# Patient Record
Sex: Female | Born: 1995 | Race: White | Hispanic: No | Marital: Single | Smoking: Never smoker
Health system: Southern US, Community
[De-identification: ages and names within clinical notes are randomized; demographics above are authoritative.]

## PROBLEM LIST (undated history)

## (undated) DIAGNOSIS — Z9109 Other allergy status, other than to drugs and biological substances: Secondary | ICD-10-CM

## (undated) DIAGNOSIS — J45909 Unspecified asthma, uncomplicated: Secondary | ICD-10-CM

---
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
## 2016-09-13 ENCOUNTER — Emergency Department (HOSPITAL_COMMUNITY)

## 2016-09-13 ENCOUNTER — Encounter (HOSPITAL_COMMUNITY): Payer: Self-pay | Admitting: Nurse Practitioner

## 2016-09-13 ENCOUNTER — Emergency Department (HOSPITAL_COMMUNITY)
Admission: EM | Admit: 2016-09-13 | Discharge: 2016-09-13 | Disposition: A | Discharge status: Home / Self Care | Attending: Emergency Medicine | Admitting: Emergency Medicine

## 2016-09-13 DIAGNOSIS — J45909 Unspecified asthma, uncomplicated: Secondary | ICD-10-CM | POA: Insufficient documentation

## 2016-09-13 DIAGNOSIS — R0989 Other specified symptoms and signs involving the circulatory and respiratory systems: Secondary | ICD-10-CM | POA: Diagnosis not present

## 2016-09-13 DIAGNOSIS — T17308A Unspecified foreign body in larynx causing other injury, initial encounter: Secondary | ICD-10-CM

## 2016-09-13 DIAGNOSIS — R09A2 Foreign body sensation, throat: Secondary | ICD-10-CM

## 2016-09-13 HISTORY — DX: Unspecified asthma, uncomplicated: J45.909

## 2016-09-13 LAB — I-STAT CHEM 8, ED
BUN: 9 mg/dL (ref 6–20)
CALCIUM ION: 1.13 mmol/L — AB (ref 1.15–1.40)
CREATININE: 0.7 mg/dL (ref 0.44–1.00)
Chloride: 105 mmol/L (ref 101–111)
Glucose, Bld: 80 mg/dL (ref 65–99)
HCT: 42 % (ref 36.0–46.0)
Hemoglobin: 14.3 g/dL (ref 12.0–15.0)
Potassium: 3.8 mmol/L (ref 3.5–5.1)
Sodium: 140 mmol/L (ref 135–145)
TCO2: 23 mmol/L (ref 0–100)

## 2016-09-13 LAB — I-STAT BETA HCG BLOOD, ED (MC, WL, AP ONLY): I-stat hCG, quantitative: <5 m[IU]/mL (ref <5)

## 2016-09-13 MED ORDER — IOPAMIDOL (ISOVUE-300) INJECTION 61%
75.0000 mL | Freq: Once | INTRAVENOUS | Status: AC | PRN
  Administered 2016-09-13: 75 mL via INTRAVENOUS

## 2016-09-13 MED ORDER — SODIUM CHLORIDE 0.9 % IJ SOLN
INTRAMUSCULAR | Status: AC
  Administered 2016-09-13: 20:00:00
  Filled 2016-09-13: qty 50

## 2016-09-13 MED ORDER — MAGIC MOUTHWASH W/LIDOCAINE: 5.0000 mL | Freq: Four times a day (QID) | ORAL | 0 refills | Status: AC | PRN

## 2016-09-13 MED ORDER — IOPAMIDOL (ISOVUE-300) INJECTION 61%
INTRAVENOUS | Status: AC
  Administered 2016-09-13: 20:00:00
  Filled 2016-09-13: qty 75

## 2016-09-13 MED ORDER — RANITIDINE HCL 150 MG PO TABS: 150.0000 mg | ORAL_TABLET | Freq: Two times a day (BID) | ORAL | 0 refills | Status: DC

## 2016-09-13 MED ORDER — GI COCKTAIL ~~LOC~~
30.0000 mL | Freq: Once | ORAL | Status: AC
  Administered 2016-09-13: 30 mL via ORAL
  Filled 2016-09-13: qty 30

## 2016-09-13 MED ORDER — DEXAMETHASONE SODIUM PHOSPHATE 10 MG/ML IJ SOLN
10.0000 mg | Freq: Once | INTRAMUSCULAR | Status: AC
  Administered 2016-09-13: 10 mg via INTRAVENOUS
  Filled 2016-09-13: qty 1

## 2016-09-13 MED ORDER — RANITIDINE HCL 150 MG PO TABS: 150.0000 mg | ORAL_TABLET | Freq: Two times a day (BID) | ORAL | 0 refills | Status: AC

## 2016-09-13 MED ORDER — MAGIC MOUTHWASH W/LIDOCAINE: 5.0000 mL | Freq: Four times a day (QID) | ORAL | 0 refills | Status: DC | PRN

## 2016-09-13 MED ORDER — LIDOCAINE 5 % EX OINT
TOPICAL_OINTMENT | Freq: Once | CUTANEOUS | Status: AC
  Administered 2016-09-13: 16:00:00 via TOPICAL
  Filled 2016-09-13: qty 35.44

## 2016-09-13 NOTE — ED Triage Notes (Signed)
Pt states she may have swallow a plastic piece of the end of a [place] leg. Anxious about not being able to swallow, ABCs have been cleared by both triage RN and EDP.

## 2016-09-13 NOTE — ED Notes (Signed)
Patient transported to CT 

## 2016-09-13 NOTE — ED Notes (Signed)
ED Provider at bedside with 2 PAs, doing procedure.

## 2016-09-13 NOTE — ED Provider Notes (Signed)
[facility]-EMERGENCY DEPT Provider Note   CSN: [id number] Arrival date & time: 09/13/16  1528     History   Chief Complaint Chief Complaint  Patient presents with  . Choking    HPI Dominique Ortiz is a 20 y.o. female.  HPI 20 yo F with PMHx as below here with sensation of FB in throat. Pt was eating [place] skin earlier today when she accidentally swallowed a "long piece of plastic" that had been on the [place] prior to cooking. She feels like it is now stuck in her throat and she is having difficulty swallowing. She endorses severe, sharp, burning throat pain that is worse with swallowing and talking. Denies any coughing or SOB. She endorses extreme anxiety and feels like her throat is "closing up." Denies any prior injuries to the area. No h/o esophageal pathology.  Past Medical History:  Diagnosis Date  . Asthma   . Environmental allergies     There are no active problems to display for this patient.   History reviewed. No pertinent surgical history.  OB History    No data available       Home Medications    Prior to Admission medications   Medication Sig Start Date End Date Taking? Authorizing Provider  Doxycycline Hyclate 75 MG TABS Take 75 mg by mouth daily. 08/12/16  Yes Historical Provider, MD  NIKKI 3-0.02 MG tablet Take 1 tablet by mouth daily. 08/12/16  Yes Historical Provider, MD  magic mouthwash w/lidocaine SOLN Take 5 mLs by mouth 4 (four) times daily as needed for mouth pain. 09/13/16   Shaune Pollackameron Zay Yeargan, MD  oxymetazoline (AFRIN NASAL SPRAY) 0.05 % nasal spray Place 1 spray into both nostrils 2 (two) times daily. No more than three days Patient not taking: Reported on 09/13/2016 12/25/13   Marisa Severinlga Otter, MD  pseudoephedrine (SUDAFED 12 HOUR) 120 MG 12 hr tablet Take 1 tablet (120 mg total) by mouth 2 (two) times daily. Patient not taking: Reported on 09/13/2016 12/25/13   Marisa Severinlga Otter, MD  ranitidine (ZANTAC) 150 MG tablet Take 1 tablet (150 mg total) by mouth 2 (two)  times daily. 09/13/16 09/20/16  Shaune Pollackameron Lynsi Dooner, MD    Family History History reviewed. No pertinent family history.  Social History Social History  Substance Use Topics  . Smoking status: Never Smoker  . Smokeless tobacco: Never Used  . Alcohol use No     Allergies   Banana and Citrus   Review of Systems Review of Systems  Constitutional: Negative for chills and fever.  HENT: Positive for sore throat. Negative for congestion, rhinorrhea, trouble swallowing and voice change.   Eyes: Negative for visual disturbance.  Respiratory: Negative for cough, shortness of breath and wheezing.   Cardiovascular: Negative for chest pain and leg swelling.  Gastrointestinal: Negative for abdominal pain, diarrhea, nausea and vomiting.  Genitourinary: Negative for dysuria, flank pain, vaginal bleeding and vaginal discharge.  Musculoskeletal: Negative for neck pain.  Skin: Negative for rash.  Allergic/Immunologic: Negative for immunocompromised state.  Neurological: Negative for syncope and headaches.  Hematological: Does not bruise/bleed easily.  All other systems reviewed and are negative.    Physical Exam Updated Vital Signs BP 112/69 (BP Location: Right Arm)   Pulse 77   Temp 98 F (36.7 C) (Oral)   Resp 14   LMP 08/23/2016   SpO2 100%   Physical Exam  Constitutional: She is oriented to person, place, and time. She appears well-developed and well-nourished. She appears distressed.  HENT:  Head: Normocephalic  and atraumatic.  Mouth/Throat: No oropharyngeal exudate.  No oropharyngeal edema or trauma.  Eyes: Conjunctivae are normal.  Neck: Normal range of motion. Neck supple.  No crepitance. No bruits. Normal ROM. No stridor.  Cardiovascular: Normal rate, regular rhythm and normal heart sounds.  Exam reveals no friction rub.   No murmur heard. Pulmonary/Chest: Effort normal and breath sounds normal. No respiratory distress. She has no wheezes. She has no rales.  Abdominal: She  exhibits no distension.  Musculoskeletal: She exhibits no edema.  Neurological: She is alert and oriented to person, place, and time. She exhibits normal muscle tone.  Skin: Skin is warm. Capillary refill takes less than 2 seconds.  Psychiatric: She has a normal mood and affect.  Nursing note and vitals reviewed.    ED Treatments / Results  Labs (all labs ordered are listed, but only abnormal results are displayed) Labs Reviewed  I-STAT CHEM 8, ED - Abnormal; Notable for the following:       Result Value   Calcium, Ion 1.13 (*)    All other components within normal limits  I-STAT BETA HCG BLOOD, ED ([facility], [facility], [facility] ONLY)    EKG  EKG Interpretation None       Radiology Dg Neck Soft Tissue  Result Date: 09/13/2016 CLINICAL DATA:  20 year old female with possible foreign body ingestion. Foreign body sensation in neck. EXAM: NECK SOFT TISSUES - 1+ VIEW COMPARISON:  None. FINDINGS: There is no evidence of retropharyngeal soft tissue swelling or epiglottic enlargement. The cervical airway is unremarkable and no radio-opaque foreign body identified. IMPRESSION: Negative. Electronically Signed   By: Harmon PierJeffrey  Hu M.D.   On: 09/13/2016 15:58   Ct Soft Tissue Neck W Contrast  Result Date: 09/13/2016 CLINICAL DATA:  Sore throat. May have swallowed plastic piece at end of [place] leg. EXAM: CT NECK WITH CONTRAST TECHNIQUE: Multidetector CT imaging of the neck was performed using the standard protocol following the bolus administration of intravenous contrast. CONTRAST:  75mL ISOVUE-300 IOPAMIDOL (ISOVUE-300) INJECTION 61% COMPARISON:  None. FINDINGS: Pharynx and larynx: No foreign body, mass, or swelling. No central compartment edema or emphysema. Mild tonsillar hypertrophy. Salivary glands: No inflammation, mass, or stone. Thyroid: Single coarse calcification in the left lobe without evidence of associated noncalcified nodule. 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 No esophageal swelling or foreign body seen in this region. Limited intracranial: Negative Visualized orbits: Negative Mastoids and visualized paranasal sinuses: Patchy mild mucosal edema. Concha bullosa and rightward septal deviation. Skeleton: No acute or aggressive finding Upper chest: No visible foreign body or air leak. IMPRESSION: Negative.  No foreign body, swelling, or soft tissue gas. Electronically Signed   By: Marnee SpringJonathon  Watts M.D.   On: 09/13/2016 18:35    Procedures Procedures (including critical care time)  Medications Ordered in ED Medications  gi cocktail (Maalox,Lidocaine,Donnatal) (30 mLs Oral Given 09/13/16 1544)  lidocaine (XYLOCAINE) 5 % ointment ( Topical Given 09/13/16 1557)  iopamidol (ISOVUE-300) 61 % injection 75 mL (75 mLs Intravenous Contrast Given 09/13/16 1750)  sodium chloride 0.9 % injection (  Given by Other 09/13/16 1935)  iopamidol (ISOVUE-300) 61 % injection (  Contrast Given 09/13/16 1936)  dexamethasone (DECADRON) injection 10 mg (10 mg Intravenous Given 09/13/16 1926)    Transnasal Flexible Fiberoscopy: The nose was anesthetized with topical lidocaine jelly. The oropharynx was anesthetized with benzocaine spray. A flexible transnasal scope was then introduced into the right nares atraumatically. The scope was passed  into the oropharynx with full visualization of epiglottis, vallecula, and remainder of oropharynx. There was no visualized foreign body or evidence of trauma. The vocal cords are freely mobile with no edema or abnormality. No visualized foreign bodies in the vallecula bilaterally.  Initial Impression / Assessment and Plan / ED Course  I have reviewed the triage vital signs and the nursing notes.  Pertinent labs & imaging results that were available during my care of the patient were reviewed by me and considered in my medical decision making (see chart for details).  Clinical Course      21 year old female here with foreign body sensation after reportedly swallowing a sharp piece of plastic. On arrival, vital signs are stable. She has no stridor, cough, or evidence of airway obstruction. Patient adamant that foreign body remains. Transnasal scope introduced and shows no evidence of edema, trauma, or bleeding. No foreign body visualized. Discussed likely persistent foreign body sensation due to transient mucosal edema from swallowing foreign body, but patient remains concerned. CT scan subsequently obtained and shows no evidence of foreign body or complication. Patient is tolerating her secretions and symptoms are improved after GI cocktail. Will discharge with Magic mouthwash, Zantac, and outpatient GI follow-up if symptoms persist. Abdomen is soft, NT, ND without evidence of perforation or obstruction.  Final Clinical Impressions(s) / ED Diagnoses   Final diagnoses:  Choking, initial encounter  Globus sensation    New Prescriptions Discharge Medication List as of 09/13/2016  7:11 PM       Shaune Pollack, MD 09/14/16 0900
